# Patient Record
Sex: Male | Born: 1970 | Race: White | Hispanic: No | Marital: Married | Smoking: Former smoker
Health system: Southern US, Community
[De-identification: ages and names within clinical notes are randomized; demographics above are authoritative.]

## PROBLEM LIST (undated history)

## (undated) DIAGNOSIS — F419 Anxiety disorder, unspecified: Secondary | ICD-10-CM

## (undated) DIAGNOSIS — I10 Essential (primary) hypertension: Secondary | ICD-10-CM

## (undated) HISTORY — PX: ANKLE FUSION: SHX5718

## (undated) HISTORY — DX: Anxiety disorder, unspecified: F41.9

## (undated) HISTORY — DX: Essential (primary) hypertension: I10

---

## 2020-08-06 ENCOUNTER — Emergency Department (HOSPITAL_BASED_OUTPATIENT_CLINIC_OR_DEPARTMENT_OTHER): Payer: BC Managed Care – PPO

## 2020-08-06 ENCOUNTER — Other Ambulatory Visit: Payer: Self-pay

## 2020-08-06 ENCOUNTER — Encounter (HOSPITAL_BASED_OUTPATIENT_CLINIC_OR_DEPARTMENT_OTHER): Payer: Self-pay | Admitting: *Deleted

## 2020-08-06 ENCOUNTER — Emergency Department (HOSPITAL_BASED_OUTPATIENT_CLINIC_OR_DEPARTMENT_OTHER)
Admission: EM | Admit: 2020-08-06 | Discharge: 2020-08-06 | Disposition: A | Discharge status: Home / Self Care | Payer: BC Managed Care – PPO | Attending: Emergency Medicine | Admitting: Emergency Medicine

## 2020-08-06 DIAGNOSIS — Z87891 Personal history of nicotine dependence: Secondary | ICD-10-CM | POA: Diagnosis not present

## 2020-08-06 DIAGNOSIS — I10 Essential (primary) hypertension: Secondary | ICD-10-CM | POA: Insufficient documentation

## 2020-08-06 DIAGNOSIS — J40 Bronchitis, not specified as acute or chronic: Secondary | ICD-10-CM | POA: Diagnosis not present

## 2020-08-06 DIAGNOSIS — R05 Cough: Secondary | ICD-10-CM | POA: Diagnosis present

## 2020-08-06 DIAGNOSIS — R059 Cough, unspecified: Secondary | ICD-10-CM

## 2020-08-06 DIAGNOSIS — Z20822 Contact with and (suspected) exposure to covid-19: Secondary | ICD-10-CM | POA: Insufficient documentation

## 2020-08-06 LAB — SARS CORONAVIRUS 2 BY RT PCR (HOSPITAL ORDER, PERFORMED IN ~~LOC~~ HOSPITAL LAB): SARS Coronavirus 2: NEGATIVE

## 2020-08-06 MED ORDER — AZITHROMYCIN 250 MG PO TABS: ORAL_TABLET | ORAL | 0 refills | Status: AC

## 2020-08-06 MED ORDER — ALBUTEROL SULFATE HFA 108 (90 BASE) MCG/ACT IN AERS
2.0000 | INHALATION_SPRAY | Freq: Once | RESPIRATORY_TRACT | Status: AC
  Administered 2020-08-06: 2 via RESPIRATORY_TRACT
  Filled 2020-08-06: qty 6.7

## 2020-08-06 NOTE — ED Provider Notes (Signed)
[facility] EMERGENCY DEPARTMENT Provider Note   CSN: [id number] Arrival date & time: 08/06/20  1502     History No chief complaint on file.   Hillel Card is a 49 y.o. male history of hypertension, previous pneumonia who presented with cough.  Patient has nonproductive cough for the last several days.  Patient states that he has the same symptoms when he had pneumonia previously.  He has no known Covid exposure.  Denies smoking history.  The history is provided by the patient.       Past Medical History:  Diagnosis Date  . Anxiety   . Hypertension     There are no problems to display for this patient.   Past Surgical History:  Procedure Laterality Date  . ANKLE FUSION         No family history on file.  Social History   Tobacco Use  . Smoking status: Former Games developer  . Smokeless tobacco: Current User    Types: Chew  Substance Use Topics  . Alcohol use: Not Currently  . Drug use: Not Currently    Home Medications Prior to Admission medications   Not on File    Allergies    Prozac [fluoxetine hcl]  Review of Systems   Review of Systems  Respiratory: Positive for shortness of breath.   All other systems reviewed and are negative.   Physical Exam Updated Vital Signs BP (!) 146/120   Pulse 78   Temp 98.3 F (36.8 C)   Resp 18   Ht 5\' 11"  (1.803 m)   Wt 117.9 kg   SpO2 100%   BMI 36.26 kg/m   Physical Exam Vitals and nursing note reviewed.  HENT:     Head: Normocephalic.     Right Ear: Tympanic membrane normal.     Nose: Nose normal.     Mouth/Throat:     Mouth: Mucous membranes are moist.  Eyes:     Extraocular Movements: Extraocular movements intact.     Pupils: Pupils are equal, round, and reactive to light.  Cardiovascular:     Rate and Rhythm: Normal rate and regular rhythm.     Pulses: Normal pulses.     Heart sounds: Normal heart sounds.  Pulmonary:     Comments: Slightly tachypneic, minimal wheezing throughout, no  crackles  Abdominal:     General: Abdomen is flat.  Musculoskeletal:        General: Normal range of motion.     Cervical back: Normal range of motion.  Skin:    General: Skin is warm.     Capillary Refill: Capillary refill takes less than 2 seconds.  Neurological:     General: No focal deficit present.     Mental Status: He is alert and oriented to person, place, and time.  Psychiatric:        Mood and Affect: Mood normal.        Behavior: Behavior normal.     ED Results / Procedures / Treatments   Labs (all labs ordered are listed, but only abnormal results are displayed) Labs Reviewed  SARS CORONAVIRUS 2 BY RT PCR (HOSPITAL ORDER, PERFORMED IN [facility])    EKG None  Radiology DG Chest Port 1 View  Result Date: 08/06/2020 CLINICAL DATA:  URI symptoms EXAM: PORTABLE CHEST 1 VIEW COMPARISON:  None. FINDINGS: The heart size and mediastinal contours are within normal limits. Both lungs are clear. The visualized skeletal structures are unremarkable. IMPRESSION: No active disease.  Electronically Signed   By: Jasmine Pang M.D.   On: 08/06/2020 15:41    Procedures Procedures (including critical care time)  Medications Ordered in ED Medications  albuterol (VENTOLIN HFA) 108 (90 Base) MCG/ACT inhaler 2 puff (has no administration in time range)    ED Course  I have reviewed the triage vital signs and the nursing notes.  Pertinent labs & imaging results that were available during my care of the patient were reviewed by me and considered in my medical decision making (see chart for details).    MDM Rules/Calculators/A&P                         Marian Grandt is a 49 y.o. male here with cough. Afebrile, well appearing. Not hypoxic. COVID negative, CXR clear. I think likely mild bronchitis. Will dc home with albuterol, zpack    Final Clinical Impression(s) / ED Diagnoses Final diagnoses:  Cough    Rx / DC Orders ED Discharge Orders    None       Charlynne Pander, MD 08/06/20 [phone]

## 2020-08-06 NOTE — Discharge Instructions (Signed)
Use albuterol every 4 hrs as needed for cough   Take zpack as prescribed   See your doctor   Return to ER if you have worse shortness of breath, fever, cough

## 2020-08-06 NOTE — ED Triage Notes (Signed)
C/o URi symptoms x 5 days

## 2022-01-14 IMAGING — DX DG CHEST 1V PORT
1 series · 1 of 1 positions shown · non-contrast
Comparison: None.

CLINICAL DATA: URI symptoms

EXAM:
PORTABLE CHEST 1 VIEW

[chest ap]
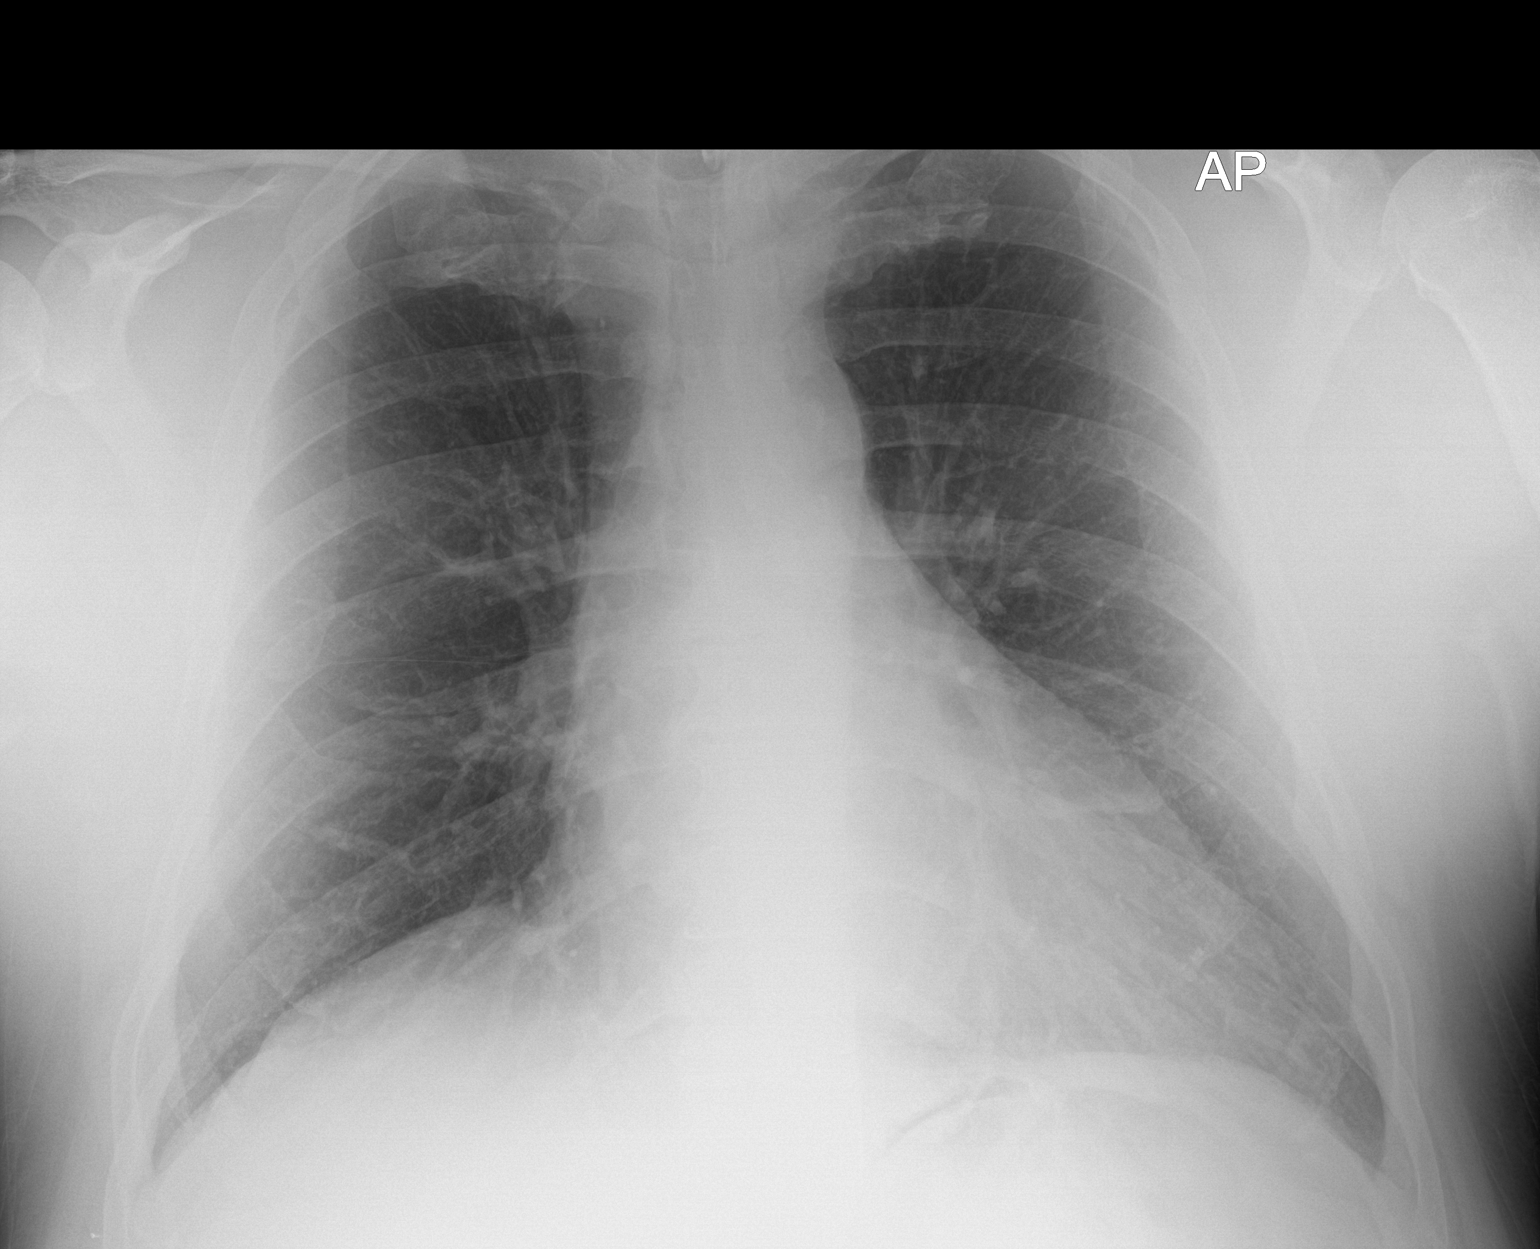

[1 of 1 positions shown; findings below may reference images not displayed]

FINDINGS: The heart size and mediastinal contours are within normal limits.
Both lungs are clear. The visualized skeletal structures are
unremarkable.
IMPRESSION: No active disease.
